# Patient Record
Sex: Female | Born: 1955 | Hispanic: Yes | Marital: Single | Smoking: Never smoker
Health system: Southern US, Community
[De-identification: ages and names within clinical notes are randomized; demographics above are authoritative.]

## PROBLEM LIST (undated history)

## (undated) DIAGNOSIS — F32A Depression, unspecified: Secondary | ICD-10-CM

## (undated) DIAGNOSIS — J45909 Unspecified asthma, uncomplicated: Secondary | ICD-10-CM

## (undated) DIAGNOSIS — F329 Major depressive disorder, single episode, unspecified: Secondary | ICD-10-CM

---

## 2005-04-13 ENCOUNTER — Encounter: Admission: RE | Admit: 2005-04-13 | Discharge: 2005-04-13 | Payer: Self-pay | Admitting: Internal Medicine

## 2006-02-19 ENCOUNTER — Encounter: Payer: Self-pay | Admitting: Family Medicine

## 2006-02-19 ENCOUNTER — Ambulatory Visit: Payer: Self-pay | Admitting: Psychiatry

## 2006-02-19 ENCOUNTER — Inpatient Hospital Stay (HOSPITAL_COMMUNITY): Admission: RE | Admit: 2006-02-19 | Discharge: 2006-02-25 | Payer: Self-pay | Admitting: Psychiatry

## 2007-02-10 ENCOUNTER — Encounter: Admission: RE | Admit: 2007-02-10 | Discharge: 2007-02-10 | Payer: Self-pay | Admitting: Internal Medicine

## 2007-07-10 ENCOUNTER — Encounter: Admission: RE | Admit: 2007-07-10 | Discharge: 2007-07-10 | Payer: Self-pay | Admitting: Internal Medicine

## 2009-01-25 ENCOUNTER — Emergency Department (HOSPITAL_COMMUNITY): Admission: EM | Admit: 2009-01-25 | Discharge: 2009-01-26 | Payer: Self-pay | Admitting: Emergency Medicine

## 2009-03-12 ENCOUNTER — Inpatient Hospital Stay (HOSPITAL_COMMUNITY): Admission: EM | Admit: 2009-03-12 | Discharge: 2009-03-14 | Payer: Self-pay | Admitting: Emergency Medicine

## 2009-03-13 ENCOUNTER — Encounter (INDEPENDENT_AMBULATORY_CARE_PROVIDER_SITE_OTHER): Payer: Self-pay | Admitting: *Deleted

## 2009-03-29 ENCOUNTER — Ambulatory Visit: Payer: Self-pay | Admitting: Diagnostic Radiology

## 2009-03-29 ENCOUNTER — Emergency Department (HOSPITAL_BASED_OUTPATIENT_CLINIC_OR_DEPARTMENT_OTHER): Admission: EM | Admit: 2009-03-29 | Discharge: 2009-03-29 | Payer: Self-pay | Admitting: Emergency Medicine

## 2009-10-23 ENCOUNTER — Encounter: Admission: RE | Admit: 2009-10-23 | Discharge: 2009-10-23 | Payer: Self-pay | Admitting: Internal Medicine

## 2009-12-28 ENCOUNTER — Other Ambulatory Visit: Payer: Self-pay | Admitting: Emergency Medicine

## 2009-12-29 ENCOUNTER — Ambulatory Visit: Payer: Self-pay | Admitting: Psychiatry

## 2009-12-29 ENCOUNTER — Inpatient Hospital Stay (HOSPITAL_COMMUNITY): Admission: RE | Admit: 2009-12-29 | Discharge: 2010-01-05 | Payer: Self-pay | Admitting: Psychiatry

## 2010-01-14 ENCOUNTER — Encounter: Admission: RE | Admit: 2010-01-14 | Discharge: 2010-01-14 | Payer: Self-pay | Admitting: Internal Medicine

## 2010-02-27 ENCOUNTER — Observation Stay (HOSPITAL_COMMUNITY): Admission: EM | Admit: 2010-02-27 | Discharge: 2010-02-28 | Payer: Self-pay | Admitting: Cardiovascular Disease

## 2010-02-28 ENCOUNTER — Encounter (INDEPENDENT_AMBULATORY_CARE_PROVIDER_SITE_OTHER): Payer: Self-pay | Admitting: *Deleted

## 2010-05-27 IMAGING — CR DG CHEST 2V
1 series · 1 of 1 positions shown · non-contrast
Comparison: 02/10/2007

CLINICAL DATA: Chest pain

CHEST - 2 VIEW

[view not recorded]
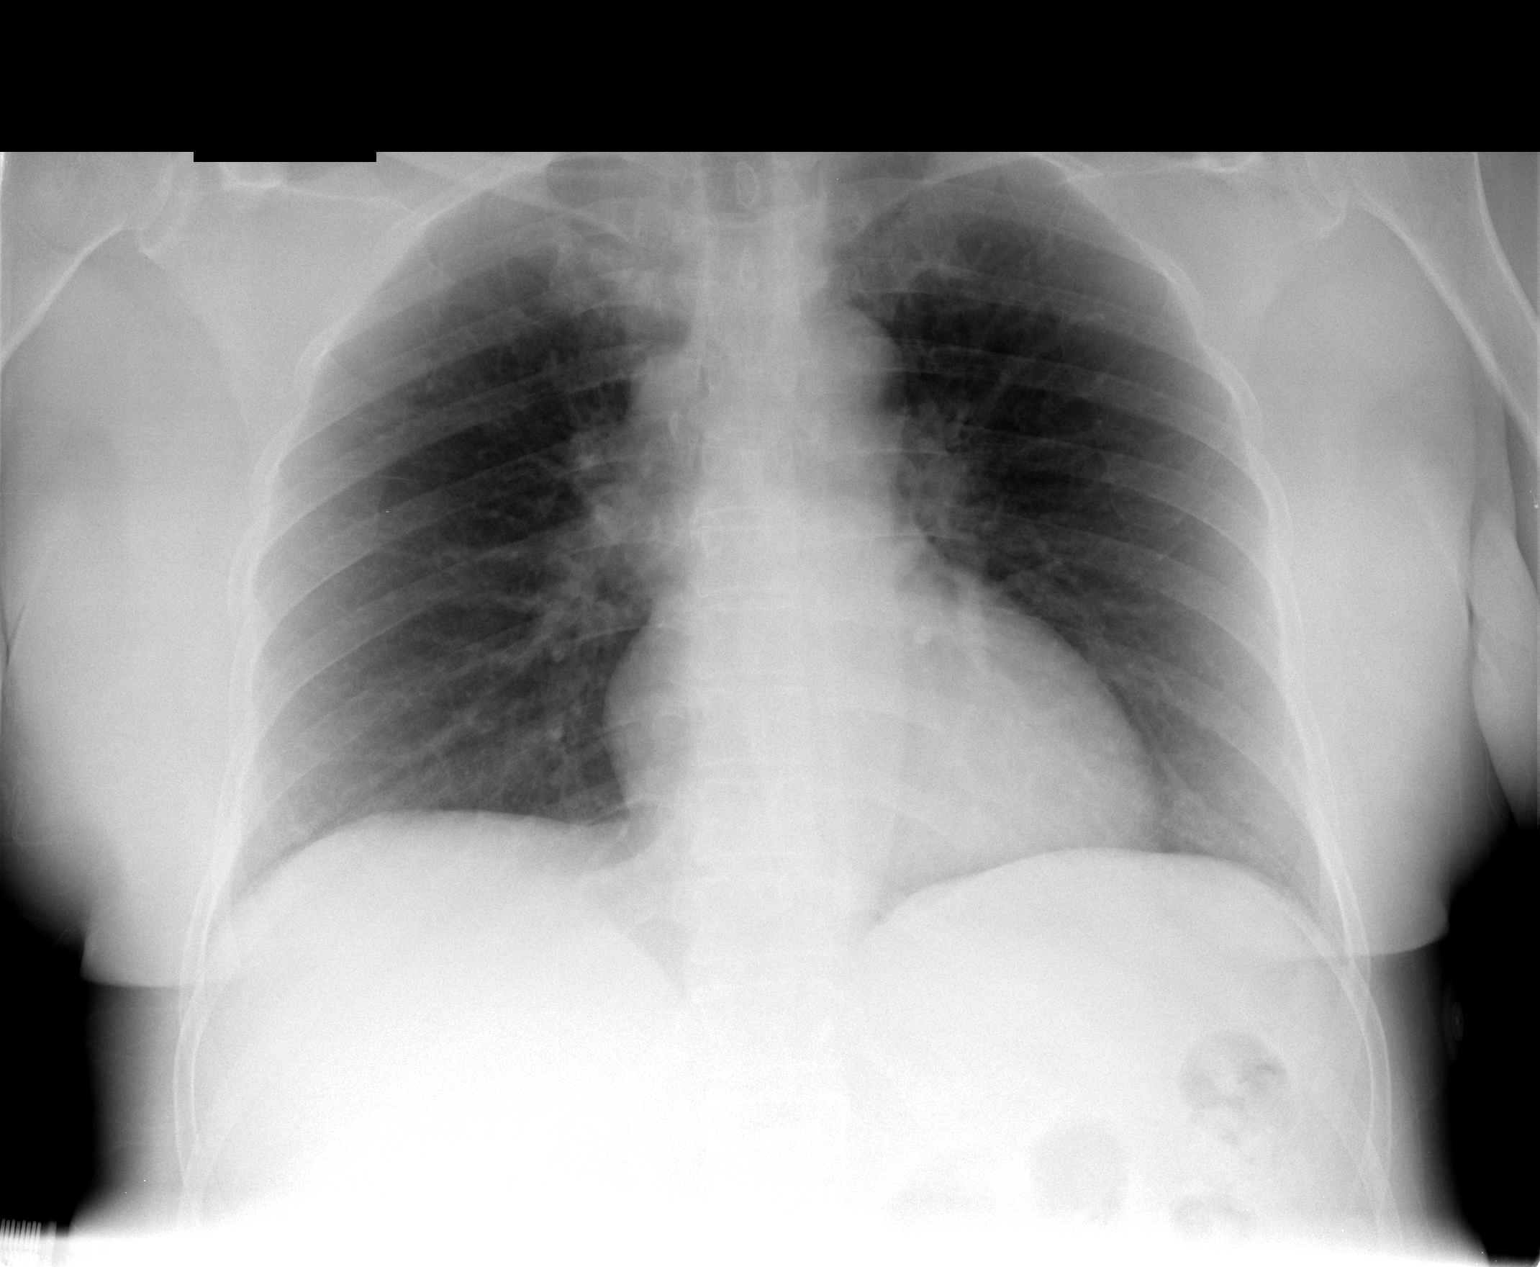

[1 of 1 positions shown; findings below may reference images not displayed]

FINDINGS: The heart size and mediastinal contours are within normal
limits.  Both lungs are clear.  The visualized skeletal structures
are unremarkable.
IMPRESSION: No active cardiopulmonary disease.

## 2010-09-04 ENCOUNTER — Emergency Department (HOSPITAL_COMMUNITY): Admission: EM | Admit: 2010-09-04 | Discharge: 2010-09-05 | Payer: Self-pay | Admitting: Emergency Medicine

## 2010-12-03 ENCOUNTER — Telehealth: Payer: Self-pay | Admitting: Internal Medicine

## 2010-12-04 ENCOUNTER — Ambulatory Visit: Payer: Self-pay | Admitting: Internal Medicine

## 2010-12-04 DIAGNOSIS — I2510 Atherosclerotic heart disease of native coronary artery without angina pectoris: Secondary | ICD-10-CM | POA: Insufficient documentation

## 2010-12-04 DIAGNOSIS — F411 Generalized anxiety disorder: Secondary | ICD-10-CM | POA: Insufficient documentation

## 2010-12-04 DIAGNOSIS — R5383 Other fatigue: Secondary | ICD-10-CM

## 2010-12-04 DIAGNOSIS — Z8679 Personal history of other diseases of the circulatory system: Secondary | ICD-10-CM | POA: Insufficient documentation

## 2010-12-04 DIAGNOSIS — I10 Essential (primary) hypertension: Secondary | ICD-10-CM | POA: Insufficient documentation

## 2010-12-04 DIAGNOSIS — F329 Major depressive disorder, single episode, unspecified: Secondary | ICD-10-CM

## 2010-12-04 DIAGNOSIS — J45909 Unspecified asthma, uncomplicated: Secondary | ICD-10-CM | POA: Insufficient documentation

## 2010-12-04 DIAGNOSIS — T7421XA Adult sexual abuse, confirmed, initial encounter: Secondary | ICD-10-CM | POA: Insufficient documentation

## 2010-12-04 DIAGNOSIS — R5381 Other malaise: Secondary | ICD-10-CM

## 2010-12-17 ENCOUNTER — Emergency Department (HOSPITAL_COMMUNITY)
Admission: EM | Admit: 2010-12-17 | Discharge: 2010-12-18 | Disposition: A | Discharge status: Other Acute Inpatient Hospital | Payer: Self-pay | Source: Home / Self Care | Admitting: Emergency Medicine

## 2010-12-17 LAB — RAPID URINE DRUG SCREEN, HOSP PERFORMED
Amphetamines: NOT DETECTED
Barbiturates: NOT DETECTED
Benzodiazepines: POSITIVE — AB
Cocaine: NOT DETECTED
Opiates: NOT DETECTED
Tetrahydrocannabinol: NOT DETECTED

## 2010-12-17 LAB — URINALYSIS, ROUTINE W REFLEX MICROSCOPIC
Bilirubin Urine: NEGATIVE
Hemoglobin, Urine: NEGATIVE
Ketones, ur: NEGATIVE mg/dL
Nitrite: NEGATIVE
Protein, ur: NEGATIVE mg/dL
Specific Gravity, Urine: 1.01 (ref 1.005–1.030)
Urine Glucose, Fasting: NEGATIVE mg/dL
Urobilinogen, UA: 0.2 mg/dL (ref 0.0–1.0)
pH: 8.5 — ABNORMAL HIGH (ref 5.0–8.0)

## 2010-12-17 LAB — BASIC METABOLIC PANEL
BUN: 11 mg/dL (ref 6–23)
CO2: 22 mEq/L (ref 19–32)
Calcium: 9.5 mg/dL (ref 8.4–10.5)
Chloride: 107 mEq/L (ref 96–112)
Creatinine, Ser: 0.81 mg/dL (ref 0.4–1.2)
GFR calc Af Amer: >60 mL/min (ref >60)
GFR calc non Af Amer: >60 mL/min (ref >60)
Glucose, Bld: 102 mg/dL — ABNORMAL HIGH (ref 70–99)
Potassium: 4.2 mEq/L (ref 3.5–5.1)
Sodium: 137 mEq/L (ref 135–145)

## 2010-12-17 LAB — DIFFERENTIAL
Basophils Absolute: 0 10*3/uL (ref 0.0–0.1)
Basophils Relative: 0 % (ref 0–1)
Eosinophils Absolute: 0.4 10*3/uL (ref 0.0–0.7)
Eosinophils Relative: 5 % (ref 0–5)
Lymphocytes Relative: 36 % (ref 12–46)
Lymphs Abs: 2.8 10*3/uL (ref 0.7–4.0)
Monocytes Absolute: 0.6 10*3/uL (ref 0.1–1.0)
Monocytes Relative: 8 % (ref 3–12)
Neutro Abs: 4 10*3/uL (ref 1.7–7.7)
Neutrophils Relative %: 51 % (ref 43–77)

## 2010-12-17 LAB — CBC
HCT: 38.6 % (ref 36.0–46.0)
Hemoglobin: 12.9 g/dL (ref 12.0–15.0)
MCH: 28.4 pg (ref 26.0–34.0)
MCHC: 33.4 g/dL (ref 30.0–36.0)
MCV: 85 fL (ref 78.0–100.0)
Platelets: 187 10*3/uL (ref 150–400)
RBC: 4.54 MIL/uL (ref 3.87–5.11)
RDW: 12.3 % (ref 11.5–15.5)
WBC: 7.7 10*3/uL (ref 4.0–10.5)

## 2010-12-18 ENCOUNTER — Inpatient Hospital Stay (HOSPITAL_COMMUNITY)
Admission: AD | Admit: 2010-12-18 | Discharge: 2010-12-25 | Payer: Self-pay | Source: Home / Self Care | Attending: Psychiatry | Admitting: Psychiatry

## 2010-12-25 ENCOUNTER — Encounter (INDEPENDENT_AMBULATORY_CARE_PROVIDER_SITE_OTHER): Payer: Self-pay | Admitting: *Deleted

## 2011-01-03 ENCOUNTER — Encounter: Payer: Self-pay | Admitting: Internal Medicine
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
## 2011-01-21 ENCOUNTER — Encounter (INDEPENDENT_AMBULATORY_CARE_PROVIDER_SITE_OTHER): Payer: Self-pay | Admitting: *Deleted

## 2011-01-25 ENCOUNTER — Encounter: Payer: Self-pay | Admitting: Internal Medicine

## 2011-01-25 ENCOUNTER — Ambulatory Visit (INDEPENDENT_AMBULATORY_CARE_PROVIDER_SITE_OTHER): Payer: Medicare Other | Admitting: Internal Medicine

## 2011-01-25 DIAGNOSIS — F3289 Other specified depressive episodes: Secondary | ICD-10-CM | POA: Insufficient documentation

## 2011-01-25 DIAGNOSIS — K625 Hemorrhage of anus and rectum: Secondary | ICD-10-CM

## 2011-01-26 ENCOUNTER — Encounter (AMBULATORY_SURGERY_CENTER): Payer: Medicare Other | Admitting: Internal Medicine

## 2011-01-26 ENCOUNTER — Encounter: Payer: Self-pay | Admitting: Internal Medicine

## 2011-01-26 DIAGNOSIS — K648 Other hemorrhoids: Secondary | ICD-10-CM

## 2011-01-26 DIAGNOSIS — K921 Melena: Secondary | ICD-10-CM

## 2011-01-26 DIAGNOSIS — K625 Hemorrhage of anus and rectum: Secondary | ICD-10-CM
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
## 2011-01-28 NOTE — Discharge Summary (Signed)
Summary: Chest Pain    NAME:  Kara Chavez, Kara Chavez           ACCOUNT NO.:  [id number]      MEDICAL RECORD NO.:  [id number]          PATIENT TYPE:  INP      LOCATION:  3707                         FACILITY:  [facility]      PHYSICIAN:  Thurmon Fair, MD     DATE OF BIRTH:  Jul 13, 1956      DATE OF ADMISSION:  02/27/2010   DATE OF DISCHARGE:  02/28/2010                                  DISCHARGE SUMMARY      DISCHARGE DIAGNOSES:   1. Chest pain, unclear etiology with normal coronaries this admission.   2. Jehovah's Witness.   3. History of anxiety, on multiple medications.      HOSPITAL COURSE:  The patient is a 55 year old female, who was referred   to [place] for evaluation of chest pain.  She had an echocardiogram, which   was abnormal and subsequently underwent a Myoview study, which also was   abnormal with perfusion abnormalities suggestive of ischemia in the   basilar and mid inferior walls.  She saw Dr. Royann Shivers on February 24, 2010,   and was set up for diagnostic catheterization.  She presented to the   office as a walk-in on February 27, 2010, with chest pain.  Her symptoms   were hard to sort out, but we felt it best to go ahead and admit her and   proceed with catheterization.  This was done by Dr. Royann Shivers on February 27, 2010, revealed normal coronaries and normal LV function.  We feel   she could be discharged on February 28, 2010.  She will follow up with Dr.   Royann Shivers and Dr. Allyne Gee.      LABORATORIES:  As an outpatient, her white count was 4.7, hemoglobin   12.2, hematocrit 37.1, and platelets 184.  INR 1.2.  TSH 1.4.  Sodium   143, potassium 4.3, BUN 16, and creatinine 1.3.  Urinalysis was   unremarkable.  EKG shows sinus rhythm without acute changes.  Please see   med rec for complete discharge medications, but basically we did not   change any of her medicines, and she will follow up with Dr. Royann Shivers in   a couple weeks, and Dr. Dorothyann Peng.               [name],  P.A.               Thurmon Fair, MD            LKK/MEDQ  D:  02/28/2010  T:  02/28/2010  Job:  [id number]      cc:   Thurmon Fair, MD   Candyce Churn. Allyne Gee, M.D.      Electronically Signed by Corine Shelter P.A. on 03/12/2010 01:36:56 PM   Electronically Signed by Thurmon Fair M.D. on 03/20/2010 11:36:49 AM

## 2011-01-28 NOTE — Letter (Signed)
Summary: New Patient letter  [facility]  [address] [place], [place] [address]   Phone: [phone]  Fax: [phone]       01/21/2011 MRN: [id number]  Kara Chavez [address] [place], [place]  [address]  Dear Kara Chavez,  Welcome to the Gastroenterology Division at [facility].    You are scheduled to see Dr.  Lina Sar on January 25, 2011 at 2:45pm on the 3rd floor at [facility], [address]  We ask that you try to arrive at our office 15 minutes prior to your appointment time to allow for check-in.  We would like you to complete the enclosed self-administered evaluation form prior to your visit and bring it with you on the day of your appointment.  We will review it with you.  Also, please bring a complete list of all your medications or, if you prefer, bring the medication bottles and we will list them.  Please bring your insurance card so that we may make a copy of it.  If your insurance requires a referral to see a specialist, please bring your referral form from your primary care physician.  Co-payments are due at the time of your visit and may be paid by cash, check or credit card.     Your office visit will consist of a consult with your physician (includes a physical exam), any laboratory testing he/she may order, scheduling of any necessary diagnostic testing (e.g. x-ray, ultrasound, CT-scan), and scheduling of a procedure (e.g. Endoscopy, Colonoscopy) if required.  Please allow enough time on your schedule to allow for any/all of these possibilities.    If you cannot keep your appointment, please call [phone] to cancel or reschedule prior to your appointment date.  This allows [place] the opportunity to schedule an appointment for another patient in need of care.  If you do not cancel or reschedule by 5 p.m. the business day prior to your appointment date, you will be charged a $50.00 late cancellation/no-show fee.     Thank you for choosing [facility] for your medical needs.  We appreciate the opportunity to care for you.  Please visit [place] at our website  to learn more about our practice.                     Sincerely,                                                             The Gastroenterology Division
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
## 2011-02-03 NOTE — Procedures (Signed)
Summary: Colonoscopy  Patient: Kara Chavez Note: All result statuses are Final unless otherwise noted.  Tests: (1) Colonoscopy (COL)   COL Colonoscopy           DONE     [facility]     [address]     [place], [place]  [address]           COLONOSCOPY PROCEDURE REPORT           PATIENT:  Kara Chavez, Kara Chavez  MR#:  [id number]     BIRTHDATE:  [birth year], 54 yrs. old  GENDER:  female     ENDOSCOPIST:  Hedwig Morton. Juanda Chance, MD     REF. BY:  Della Goo, M.D.     PROCEDURE DATE:  01/26/2011     PROCEDURE:  Colonoscopy 19[address]     ASA CLASS:  Class III     INDICATIONS:  hematochezia, rectal bleeding anoscopic exam     yesteday did not show any significant lesion, pt started on     Analpram cream 2.5%     MEDICATIONS:   propofol (Diprivan) 200 mg           DESCRIPTION OF PROCEDURE:   After the risks benefits and     alternatives of the procedure were thoroughly explained, informed     consent was obtained.  Digital rectal exam was performed and     revealed no rectal masses.   The LB 180AL [id number] endoscope was     introduced through the anus and advanced to the cecum, which was     identified by both the appendix and ileocecal valve, without     limitations.  The quality of the prep was good, using MiraLax.     The instrument was then slowly withdrawn as the colon was fully     examined.     <<PROCEDUREIMAGES>>           FINDINGS:  Internal Hemorrhoids were found (see image7, image6,     and image5). small inactive internal hems  This was otherwise a     normal examination of the colon (see image2, image1, and image3).     Retroflexed views in the rectum revealed no abnormalities.    The     scope was then withdrawn from the patient and the procedure     completed.           COMPLICATIONS:  None     ENDOSCOPIC IMPRESSION:     1) Internal hemorrhoids     2) Otherwise normal examination     hematochezia likely secondary to internal hemorrhoids  RECOMMENDATIONS:     continue Analpram cream bid as ordered yesterday,     REPEAT EXAM:  In 10 year(s) for.           ______________________________     Hedwig Morton. Juanda Chance, MD           CC:           n.     eSIGNED:   Hedwig Morton. Brodie at 01/26/2011 03:22 PM           Ailene Rud, [phone]  Note: An exclamation mark (!) indicates a result that was not dispersed into the flowsheet. Document Creation Date: 01/26/2011 3:22 PM _______________________________________________________________________  (1) Order result status: Final Collection or observation date-time: 01/26/2011 15:10 Requested date-time:  Receipt date-time:  Reported date-time:  Referring Physician:   Ordering Physician: Lina Sar [phone]) Specimen Source:  Source: Launa Grill Order  Number: [id number] Lab site:   Appended Document: Colonoscopy    Clinical Lists Changes  Observations: Added new observation of COLONNXTDUE: 01/2021 (01/26/2011 16:09)

## 2011-02-12 ENCOUNTER — Ambulatory Visit (HOSPITAL_COMMUNITY): Payer: Self-pay | Admitting: Psychology

## 2011-02-25 LAB — CBC
HCT: 36.4 % (ref 36.0–46.0)
MCH: 28.3 pg (ref 26.0–34.0)
MCHC: 33.8 g/dL (ref 30.0–36.0)
MCV: 83.9 fL (ref 78.0–100.0)
Platelets: 159 10*3/uL (ref 150–400)
RDW: 12.9 % (ref 11.5–15.5)
WBC: 5 10*3/uL (ref 4.0–10.5)

## 2011-02-25 LAB — BASIC METABOLIC PANEL
BUN: 15 mg/dL (ref 6–23)
CO2: 26 mEq/L (ref 19–32)
Calcium: 9 mg/dL (ref 8.4–10.5)
Chloride: 107 mEq/L (ref 96–112)
Creatinine, Ser: 0.77 mg/dL (ref 0.4–1.2)
GFR calc Af Amer: >60 mL/min (ref >60)
GFR calc non Af Amer: >60 mL/min (ref >60)
Glucose, Bld: 115 mg/dL — ABNORMAL HIGH (ref 70–99)
Potassium: 3.5 mEq/L (ref 3.5–5.1)
Sodium: 139 mEq/L (ref 135–145)

## 2011-02-25 LAB — DIFFERENTIAL
Basophils Absolute: 0 10*3/uL (ref 0.0–0.1)
Eosinophils Absolute: 0.3 10*3/uL (ref 0.0–0.7)
Eosinophils Relative: 6 % — ABNORMAL HIGH (ref 0–5)
Monocytes Absolute: 0.3 10*3/uL (ref 0.1–1.0)

## 2011-02-25 LAB — ETHANOL: Alcohol, Ethyl (B): <5 mg/dL (ref 0–10)

## 2011-02-25 LAB — RAPID URINE DRUG SCREEN, HOSP PERFORMED
Amphetamines: NOT DETECTED
Barbiturates: NOT DETECTED
Benzodiazepines: NOT DETECTED
Cocaine: NOT DETECTED
Opiates: NOT DETECTED
Tetrahydrocannabinol: NOT DETECTED

## 2011-03-01 LAB — BASIC METABOLIC PANEL
BUN: 22 mg/dL (ref 6–23)
CO2: 26 mEq/L (ref 19–32)
Calcium: 9.6 mg/dL (ref 8.4–10.5)
Chloride: 104 mEq/L (ref 96–112)
Creatinine, Ser: 1.07 mg/dL (ref 0.4–1.2)
GFR calc Af Amer: >60 mL/min (ref >60)
GFR calc non Af Amer: 54 mL/min — ABNORMAL LOW (ref >60)
Glucose, Bld: 110 mg/dL — ABNORMAL HIGH (ref 70–99)
Potassium: 3.6 mEq/L (ref 3.5–5.1)
Sodium: 138 mEq/L (ref 135–145)

## 2011-03-01 LAB — DIFFERENTIAL
Basophils Relative: 0 % (ref 0–1)
Eosinophils Relative: 18 % — ABNORMAL HIGH (ref 0–5)
Lymphocytes Relative: 36 % (ref 12–46)
Neutro Abs: 2.4 10*3/uL (ref 1.7–7.7)
Neutrophils Relative %: 38 % — ABNORMAL LOW (ref 43–77)

## 2011-03-01 LAB — CBC
Hemoglobin: 12.3 g/dL (ref 12.0–15.0)
MCHC: 33.2 g/dL (ref 30.0–36.0)
MCV: 85.8 fL (ref 78.0–100.0)
RBC: 4.33 MIL/uL (ref 3.87–5.11)
WBC: 6.2 10*3/uL (ref 4.0–10.5)

## 2011-03-01 LAB — RAPID URINE DRUG SCREEN, HOSP PERFORMED
Barbiturates: NOT DETECTED
Benzodiazepines: NOT DETECTED

## 2011-03-01 LAB — ETHANOL: Alcohol, Ethyl (B): <5 mg/dL (ref 0–10)

## 2011-03-07 LAB — GLUCOSE, CAPILLARY: Glucose-Capillary: 79 mg/dL (ref 70–99)

## 2011-03-24 LAB — URINALYSIS, ROUTINE W REFLEX MICROSCOPIC
Bilirubin Urine: NEGATIVE
Glucose, UA: NEGATIVE mg/dL
Hgb urine dipstick: NEGATIVE
Ketones, ur: NEGATIVE mg/dL
Protein, ur: NEGATIVE mg/dL

## 2011-03-24 LAB — BASIC METABOLIC PANEL
CO2: 27 mEq/L (ref 19–32)
Chloride: 107 mEq/L (ref 96–112)
GFR calc Af Amer: >60 mL/min (ref >60)
Potassium: 4.3 mEq/L (ref 3.5–5.1)

## 2011-03-24 LAB — CBC
HCT: 38.4 % (ref 36.0–46.0)
Hemoglobin: 12.5 g/dL (ref 12.0–15.0)
MCHC: 32.6 g/dL (ref 30.0–36.0)
MCV: 87.7 fL (ref 78.0–100.0)
RBC: 4.38 MIL/uL (ref 3.87–5.11)
WBC: 5.4 10*3/uL (ref 4.0–10.5)

## 2011-03-24 LAB — DIFFERENTIAL
Basophils Relative: 0 % (ref 0–1)
Eosinophils Absolute: 0.3 10*3/uL (ref 0.0–0.7)
Eosinophils Relative: 6 % — ABNORMAL HIGH (ref 0–5)
Lymphs Abs: 2.5 10*3/uL (ref 0.7–4.0)
Monocytes Absolute: 0.4 10*3/uL (ref 0.1–1.0)
Monocytes Relative: 8 % (ref 3–12)

## 2011-03-24 LAB — POCT TOXICOLOGY PANEL

## 2011-03-24 LAB — URINE MICROSCOPIC-ADD ON

## 2011-03-25 LAB — CBC
HCT: 37.8 % (ref 36.0–46.0)
MCV: 86.8 fL (ref 78.0–100.0)
RBC: 4.35 MIL/uL (ref 3.87–5.11)
WBC: 5.6 10*3/uL (ref 4.0–10.5)

## 2011-03-25 LAB — POCT CARDIAC MARKERS
CKMB, poc: <1 ng/mL — ABNORMAL LOW (ref 1.0–8.0)
Troponin i, poc: <0.05 ng/mL (ref 0.00–0.09)
Troponin i, poc: <0.05 ng/mL (ref 0.00–0.09)

## 2011-03-25 LAB — DIFFERENTIAL
Eosinophils Absolute: 0.2 10*3/uL (ref 0.0–0.7)
Eosinophils Relative: 4 % (ref 0–5)
Lymphs Abs: 2.2 10*3/uL (ref 0.7–4.0)
Monocytes Relative: 6 % (ref 3–12)

## 2011-03-25 LAB — BASIC METABOLIC PANEL
BUN: 16 mg/dL (ref 6–23)
CO2: 22 mEq/L (ref 19–32)
Chloride: 105 mEq/L (ref 96–112)
GFR calc Af Amer: >60 mL/min (ref >60)
Potassium: 3.3 mEq/L — ABNORMAL LOW (ref 3.5–5.1)

## 2011-03-25 LAB — POCT I-STAT, CHEM 8
BUN: 17 mg/dL (ref 6–23)
Calcium, Ion: 1.18 mmol/L (ref 1.12–1.32)
Chloride: 107 mEq/L (ref 96–112)
Creatinine, Ser: 0.9 mg/dL (ref 0.4–1.2)
Glucose, Bld: 122 mg/dL — ABNORMAL HIGH (ref 70–99)

## 2011-03-30 LAB — RAPID URINE DRUG SCREEN, HOSP PERFORMED
Barbiturates: NOT DETECTED
Benzodiazepines: NOT DETECTED

## 2011-03-30 LAB — DIFFERENTIAL
Basophils Absolute: 0 10*3/uL (ref 0.0–0.1)
Basophils Relative: 1 % (ref 0–1)
Monocytes Absolute: 0.6 10*3/uL (ref 0.1–1.0)
Neutro Abs: 2.6 10*3/uL (ref 1.7–7.7)
Neutrophils Relative %: 39 % — ABNORMAL LOW (ref 43–77)

## 2011-03-30 LAB — CBC
Hemoglobin: 13.4 g/dL (ref 12.0–15.0)
MCHC: 34.7 g/dL (ref 30.0–36.0)
RDW: 13.8 % (ref 11.5–15.5)

## 2011-03-30 LAB — POCT I-STAT, CHEM 8
Calcium, Ion: 1.14 mmol/L (ref 1.12–1.32)
Chloride: 110 mEq/L (ref 96–112)
HCT: 40 % (ref 36.0–46.0)
Hemoglobin: 13.6 g/dL (ref 12.0–15.0)

## 2011-04-27 NOTE — Discharge Summary (Signed)
NAMEMEE, MACDONNELL     ACCOUNT NO.:  [id number]   MEDICAL RECORD NO.:  [id number]          PATIENT TYPE:  INP   LOCATION:  3037                         FACILITY:  [facility]   PHYSICIAN:  Deanna Artis. Hickling, M.D.DATE OF BIRTH:  09-02-1956   DATE OF ADMISSION:  03/11/2009  DATE OF DISCHARGE:  03/14/2009                               DISCHARGE SUMMARY   FINAL DIAGNOSES:  Functional weakness and numbness of the left arm and  leg. (342.02, 782.0)   PROCEDURES:  1. MRI scan of the brain limited.  2. CT chest, abdomen, and pelvis.   COMPLICATIONS:  None.   SUMMARY OF THE HOSPITALIZATION:  The patient is a 55 year old Hispanic  female who presented as a code stroke to [facility]  around 2254 hours on March 11, 2009.  CT scan of the brain was normal.  This was perplexing in the sense that the patient claimed to have had a  stroke 12 years before and to have walked with a cane for 6 years.   Her assessment showed evidence of inconsistent findings and functional  left-sided weakness.  Nonetheless, in order to be certain that she did  not have a stroke that would require treatment with t-PA, a limited MRI  scan of the brain was carried out and was normal.   The patient showed severe weakness of the proximal muscles of the left  arm and leg, but was able wiggle her fingers and to a lesser extent her  toes.  She had an NIH stroke scale of 8.  She was anxious, trembling,  but after being given 2 mg of Ativan for the MRI scan, she seemed much  calmer.   She had a stroke swallow screen that she failed initially, and then she  passed it.  Given that there was no stroke and the symptoms were  functional, she was not a candidate for t-PA.   The patient was placed on the regular service.  The next morning, she  had marked improvement in strength on her left side.  In the upper  extremity, she was 4/5 with good fine motor movements, in the lower  extremity, she was 3/5  proximally and 4/5 distally.  There still  appeared to be sensory abnormality, but it is with midline and was felt  to be functional.   We were able to have her seen by Physical and Occupational Therapy and  get her up with a walker.  This allowed Foley catheter to be  discontinued.  She claimed to be unable to urinate when lying down.   On the morning of March 13, 2009, she had further improved.  She had  normal strength in the right upper extremity.  In the left upper  extremity, she had 4/5 strength in the biceps, triceps, and 4-/5 in the  deltoid.  Fine motor movements were fair.  Left lower extremity hip  flexor 3, psoas 4+, knee flexor 4, knee extensor 4+ with plantar flexor  4+, dorsiflexor 4-.  She had no reflex predominance, though her reflexes  were brisk.  Her toes were bilaterally flexor.  She had an inconsistent  sensory examination,  which again split the midline.   Because of ongoing symptoms of left flank pain, I ordered a CT scan of  the chest, abdomen, and pelvis, this returned normal.  This was done  without and with contrast.   This morning, the patient's vital signs, temperature 97.8, blood  pressure 113/73, resting pulse 62, respirations 16, oxygen saturation  96%.  The patient is awake, alert.  She has minimal complaints of pain  in her left flank and is not using narcotic medications.  Her lungs were  clear.  There were no bruits.  She had a supple neck.  Her heart showed  no murmurs.  Mental status, alert, pleasant without dysphagia or  dysarthria.  Cranial nerves, round reactive pupils.  Visual fields full.  Extraocular movements full.  Symmetric facial strength.  Midline tongue  and uvula.  Motor examination, normal right-sided strength.  The left  side is near normal with some giveaway both proximally and distally.  She has drift of her left arm, but no pronation.  She had good fine  motor movements to finger apposition.  When she walked with a walker,  she  picked up her left foot.  She is walking stably with a walker.  Deep  tendon reflexes are brisk without clonus.  She had bilateral flexor  plantar responses.  Sensation is still subjectively decreased with  midline.  She had good stereoagnosis.   The patient will be discharged home.  Recommendations by Physical and  Occupational Therapy have been made for home PT and OT.  I will also add  home nursing.  She will return to see the nurse practitioner in about 2  months or sooner as needed.  The nurse is needed in order to make  certain that she has a safe environment at home.  I do not think that  involving a psychiatrist will be helpful here.  She has seen a  psychiatrist for her depression and anxiety.  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  She has  asked me to fill out a disability form, which I will do, but I have told  her that it will not help her and she will not gain disability from her  status.   I should mention the patient just became a citizen of the [place]  on the day she had her symptoms.   Her discharge medications include Effexor XR 75 mg 2 caplets daily,  clonazepam 1 mg twice daily, Soma 350 mg 3 times daily, naproxen 500 mg  twice daily, Advair Diskus 100/50 one puff daily as needed, Ventolin 90  mcg 1 puff daily as needed.   DRUG ALLERGIES:  ASPIRIN (hives) and CAFFEINE (agitation).   She is discharged to home improved condition.  She will be followed by  one of my nurse practitioner's in 2 months' time.  There is no specific  therapy that we can offer this woman, but I believe that she needs  followup at least once.      Deanna Artis. Sharene Skeans, M.D.  Electronically Signed     [facility]/MED[facility]  D:  03/14/2009  T:  03/14/2009  Job:  [id number]

## 2011-04-27 NOTE — H&P (Signed)
Kara Chavez, Kara Chavez     ACCOUNT NO.:  [id number]   MEDICAL RECORD NO.:  [id number]          PATIENT TYPE:  INP   LOCATION:  3037                         FACILITY:  [facility]   PHYSICIAN:  Deanna Artis. Hickling, M.D.DATE OF BIRTH:  [birth year]   DATE OF ADMISSION:  03/11/2009  DATE OF DISCHARGE:                              HISTORY & PHYSICAL   CHIEF COMPLAINT:  Left-sided weakness and pain.   HISTORY OF PRESENT CONDITION:  The patient is a 55 year old married  Hispanic woman, mother of 4, who had a stroke 12 years ago under very  similar circumstances today.  She stated that she was disabled and walk  with a cane for 6 years and has been well for the past 6 years.   The patient had a citizenship ceremony in [place] today and became a  [place] citizen.  She was very excited and became upset, may have had a panic-  like attack.   She settled down and went to a [employer].  There she  developed pain on her left side and weakness.  It was last known normal  at 9:15 p.m.   She arrived at [facility] at 2254.  She was evaluated by the  emergency physicians and sent for a CT scan.  I was contacted at 2320  and arrived 2330 to review her CT scan and to examine her 2335.   The patient has had a history of depression and anxiety, asthma.  She  has had 4 cesarean sections.  She has no other stroke risk factors  except for the possible previous stroke.  Her CT scan of the brain was  entirely normal.  This makes the claim of prior stroke highly suspect.   CURRENT MEDICATIONS:  Clonazepam, dose unknown 3 times daily.  She has  some other antidepressant.  We need to get those medicines from home.   DRUG ALLERGIES:  She has intolerance to ASPIRIN and also CAFFEINE.   FAMILY HISTORY:  Positive for stroke in her grandfather, myocardial  infarction in father, both fatal; anorexia, depression; and death in her  mother at age 18.  She had a sister in her 76s, who had some  form of  heart problem.   SOCIAL HISTORY:  The patient has 4 children.  She is married.  Her  husband and one her children at bedside.  Children range in age from 71  down to 51.  She is a Neurosurgeon.  She does not use tobacco, alcohol, or  drugs.   REVIEW OF SYSTEMS:  Remarkable for arthritis, anxiety and depression, 12  system review is otherwise negative.  There is a very significant  language barrier.  I had some translation.   PHYSICAL EXAMINATION:  VITAL SIGNS:  Today, temperature 98.4; blood  pressure initially 106/72, rose to 156/76; heart rate initially 64, rose  to 80; respirations initially 14, rose to 22; and she became more  anxious.  Oxygen saturation is 98%.  Weight estimated 155 pounds.  EARS, NOSE, AND THROAT:  No infections.  NECK:  Supple.  No bruits.  LUNGS:  Clear.  HEART:  No murmurs.  Pulses normal.  ABDOMEN:  The bowel sounds normal.  No hepatosplenomegaly, somewhat  protuberant.  SKIN:  No lesions.  NEUROLOGIC:  Awake, anxious, trembling inconsistent on her examination.  She spoke fluent [place] and a little bit of [place].  CRANIAL NERVES:  Round and reactive pupils.  Visual fields were full,  however inconsistent.  When I brought my hand over to the left superior  quadrant, she shot a glance over to my hand.  She inconsistently counted  to double simultaneous stimuli on the right side and left side.  She  always was able to count well on the left side.  I did not feel that  this was evidence of extinguishment.   She had symmetric facial strength, midline tongue, and uvula.  She had  air conduction greater than bone conduction.  Motor examination, the  patient had normal strength on the right side in her arm and her leg.  Good fine motor movements.  Good finger apposition.  No drift.  On the  left side, she was able to wiggle her fingers on her toes a bit, but she  did not have any proximal movements at all.  The arm flopped down;  however,  interestingly it would not flop and hit her in the face.   Sensation was different in comparing the right side and left side, but  again it was inconsistent.  Sometime, she had difficulty telling 1 point  or 2 points, even several inches apart on the same right arm.   She had good stereognosis, however, in both hands.  Deep tendon reflexes  were symmetrically diminished.  The patient had a right flexor plantar  with strong withdrawal response and had no response to plantar  stimulation on the left.   LABORATORY STUDIES:  Sodium 133, potassium 3.3, chloride 105, CO2 22,  BUN 16, creatinine is 0.82, glucose 110, and calcium 9.3.  White blood  cell count 5600, hemoglobin 12.9, hematocrit 37.8, and platelet count  181,000.  PT 14.5 and INR 1.1.   IMPRESSION:  1. Left-sided weakness and numbness, as part of a hysterical      conversion reaction. (342.02, 782.0)  2. History of 6 years of disability on her left side with a normal CT      scan.   I ordered an MRI scan that limited MRI scan of the brain for diffusion-  weighted imaging, T2 and flare.  They are all normal.  There is no  evidence of acute stroke.  There is no evidence of remote stroke.  The  patient did not need and did not receive t-PA.  We will admit her in the  hospital overnight.  If she continues to have disability, we may have  her seen by psychiatrist with translator.  I have conveyed my opinions  to the family and asked them not to be confrontative.      Deanna Artis. Sharene Skeans, M.D.  Electronically Signed     [facility]/MEDQ  D:  03/12/2009  T:  03/12/2009  Job:  [id number]

## 2011-04-30 NOTE — Discharge Summary (Signed)
NAMESHELANDA, Kara Chavez          ACCOUNT NO.:  [id number]   MEDICAL RECORD NO.:  [id number]          PATIENT TYPE:  IPS   LOCATION:  0302                          FACILITY:  [facility]   PHYSICIAN:  Geoffery Lyons, M.D.      DATE OF BIRTH:  July 20, 1956   DATE OF ADMISSION:  02/19/2006  DATE OF DISCHARGE:  02/25/2006                                 DISCHARGE SUMMARY   CHIEF COMPLAINT AND PRESENTING ILLNESS:  This was the first admission to  [facility]  for this 55 year old separated female,  voluntarily admitted.  History of depression worsening, with thoughts of  suicide, no particular plan, positive auditory hallucinations calling her  name, feeling very stressed, unable to identify any specific stressors.  Not  being able to sleep.  Dislocated left shoulder recently playing with a  child.  Feeling very anxious.   PAST PSYCHIATRIC HISTORY:  First time Behavioral Health,prior to his  admission he chose to be hospitalized, no current outpatient treatment.   ALCOHOL AND DRUG HISTORY:  Non smoker, denies any active alcohol and drug  use.   PAST MEDICAL HISTORY:  History of 2 CVAs, last one in 1997,   MEDICATIONS:  Zoloft 200 mg per day, Prolixin 10 mg per day, naproxen 500  twice a day, Klonopin.   PHYSICAL EXAMINATION:  Performed, failed to show any acute findings.   LABORATORY WORKUP:  CBC:  White blood cells 6.8, hemoglobin 12.3.  Blood  chemistries: SGOT 20, SGPT 22, total bilirubin 0.7.  TSH 2.446.   MENTAL STATUS EXAM:  Reveals a fully alert female, very soft spoken, normal  rate, tempo and production.  Mood depressed, anxious, affect depressed,  endorsing positive auditory hallucinations, some underlying paranoia.  Cognition well preserved.   ADMISSION DIAGNOSES:  AXIS I:  Major depression with psychotic features.  AXIS II:  No diagnosis.  AXIS III:  Status post 2 cardiovascular accidents.  AXIS IV:  Moderate.  AXIS V:  Upon admission 25-30, highest global  assessment of functioning in  last year 65-70.   COURSE IN HOSPITAL:  She was admitted and started in individual and group  psychotherapy.  She was placed on the Zoloft 100 mg in the morning, Klonopin  1 mg twice a day, naproxen 500 twice a day, Prolixin 10 mg per day, was  given Ambien for sleep.  She was eventually placed on Zoloft 50, placed on  Zyprexa rather than Prolixin and we evaluated the shoulder further.  Zyprexa  was increased to 10 mg at night, Zoloft was discontinued and she was started  on Effexor.  She endorsed persistent depression, got worse a few months  prior to this admission.  Initially from the [place], one child,  husband left to go to [place] has been raising the child by herself, on  disability, status post CVA with some residual motor deficits.  Endorsed  wanting to die, no ambition, no motivation, anhedonia, suicidal ruminations,  psychomotor retardation, hard time processing.  Felt that the Zoloft was not  working any more.  As we modified medications, she endorsed she was starting  to feel  a little better.  By March 13 she was not hearing voices, started to  open up, talked about the stressors.  Pain in the shoulder was an issue.  X-  ray was not suggestive of any gross trauma.  She had an appointment with  orthopedics we had to reschedule.  We went up on the Effexor to 75 mg per  day.  On March 15 she was sleeping better, she was starting to feel some  better, no hallucinations.  March 16 she was in full contact with reality,  no suicidal or homicidal ideas, no hallucinations, no delusions, objectively  better.  Mood improved, affect brighter, willing and motivated to pursue  further outpatient treatment.   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  AXIS II:  No diagnosis.  AXIS III:  Status post two cardiovascular accidents and trauma to shoulder.  AXIS IV:  Moderate.  AXIS V:  Upon discharge 50-55.    DISCHARGE MEDICATIONS:  1.  Klonopin 1 mg twice a day.  2.  Naproxen 500 twice a day.  3.  Soma 350 every 8 hours as needed.  4.  Zyprexa 10 mg at night.  5.  Effexor XR 75 mg per day.   DISPOSITION:  Follow up with Dr. Raquel James.      Geoffery Lyons, M.D.  Electronically Signed     IL/MEDQ  D:  03/14/2006  T:  03/16/2006  Job:  [id number]

## 2011-05-07 ENCOUNTER — Other Ambulatory Visit: Payer: Self-pay | Admitting: Internal Medicine

## 2011-05-07 DIAGNOSIS — Z1231 Encounter for screening mammogram for malignant neoplasm of breast: Secondary | ICD-10-CM

## 2011-05-17 ENCOUNTER — Ambulatory Visit
Admission: RE | Admit: 2011-05-17 | Discharge: 2011-05-17 | Disposition: A | Discharge status: Home / Self Care | Payer: Medicare Other | Source: Ambulatory Visit | Attending: Internal Medicine | Admitting: Internal Medicine

## 2011-05-17 DIAGNOSIS — Z1231 Encounter for screening mammogram for malignant neoplasm of breast: Secondary | ICD-10-CM

## 2011-08-06 ENCOUNTER — Emergency Department (HOSPITAL_COMMUNITY)
Admission: EM | Admit: 2011-08-06 | Discharge: 2011-08-09 | Disposition: A | Discharge status: Home / Self Care | Payer: Medicare Other | Attending: Emergency Medicine | Admitting: Emergency Medicine

## 2011-08-06 ENCOUNTER — Emergency Department (HOSPITAL_COMMUNITY): Payer: Medicare Other

## 2011-08-06 DIAGNOSIS — Z8673 Personal history of transient ischemic attack (TIA), and cerebral infarction without residual deficits: Secondary | ICD-10-CM | POA: Insufficient documentation

## 2011-08-06 DIAGNOSIS — F411 Generalized anxiety disorder: Secondary | ICD-10-CM | POA: Insufficient documentation

## 2011-08-06 DIAGNOSIS — R259 Unspecified abnormal involuntary movements: Secondary | ICD-10-CM | POA: Insufficient documentation

## 2011-08-06 DIAGNOSIS — M79609 Pain in unspecified limb: Secondary | ICD-10-CM | POA: Insufficient documentation

## 2011-08-06 DIAGNOSIS — R42 Dizziness and giddiness: Secondary | ICD-10-CM | POA: Insufficient documentation

## 2011-08-06 DIAGNOSIS — F341 Dysthymic disorder: Secondary | ICD-10-CM | POA: Insufficient documentation

## 2011-08-06 DIAGNOSIS — G319 Degenerative disease of nervous system, unspecified: Secondary | ICD-10-CM | POA: Insufficient documentation

## 2011-08-06 DIAGNOSIS — R29898 Other symptoms and signs involving the musculoskeletal system: Secondary | ICD-10-CM | POA: Insufficient documentation

## 2011-08-06 DIAGNOSIS — Z79899 Other long term (current) drug therapy: Secondary | ICD-10-CM | POA: Insufficient documentation

## 2011-08-06 LAB — COMPREHENSIVE METABOLIC PANEL
ALT: 17 U/L (ref 0–35)
AST: 20 U/L (ref 0–37)
Albumin: 3.7 g/dL (ref 3.5–5.2)
CO2: 20 mEq/L (ref 19–32)
Chloride: 105 mEq/L (ref 96–112)
GFR calc non Af Amer: >60 mL/min (ref >60)
Sodium: 137 mEq/L (ref 135–145)
Total Bilirubin: 0.4 mg/dL (ref 0.3–1.2)

## 2011-08-06 LAB — CBC
Hemoglobin: 12.5 g/dL (ref 12.0–15.0)
RBC: 4.49 MIL/uL (ref 3.87–5.11)
WBC: 7 10*3/uL (ref 4.0–10.5)

## 2011-08-06 LAB — URINALYSIS, ROUTINE W REFLEX MICROSCOPIC
Glucose, UA: NEGATIVE mg/dL
Leukocytes, UA: NEGATIVE
Nitrite: NEGATIVE
Specific Gravity, Urine: 1.007 (ref 1.005–1.030)
pH: 8.5 — ABNORMAL HIGH (ref 5.0–8.0)

## 2011-08-06 LAB — DIFFERENTIAL
Basophils Absolute: 0 10*3/uL (ref 0.0–0.1)
Basophils Relative: 0 % (ref 0–1)
Monocytes Relative: 4 % (ref 3–12)
Neutro Abs: 5.1 10*3/uL (ref 1.7–7.7)
Neutrophils Relative %: 74 % (ref 43–77)

## 2011-08-06 LAB — RAPID URINE DRUG SCREEN, HOSP PERFORMED
Barbiturates: NOT DETECTED
Benzodiazepines: NOT DETECTED
Tetrahydrocannabinol: NOT DETECTED

## 2011-08-07 ENCOUNTER — Emergency Department (HOSPITAL_COMMUNITY): Payer: Medicare Other

## 2012-08-26 ENCOUNTER — Encounter (HOSPITAL_COMMUNITY): Payer: Self-pay | Admitting: Emergency Medicine

## 2012-08-26 ENCOUNTER — Emergency Department (HOSPITAL_COMMUNITY): Payer: Medicare Other

## 2012-08-26 ENCOUNTER — Emergency Department (HOSPITAL_COMMUNITY)
Admission: EM | Admit: 2012-08-26 | Discharge: 2012-08-26 | Disposition: A | Discharge status: Home / Self Care | Payer: Medicare Other | Attending: Emergency Medicine | Admitting: Emergency Medicine

## 2012-08-26 DIAGNOSIS — Z888 Allergy status to other drugs, medicaments and biological substances status: Secondary | ICD-10-CM | POA: Insufficient documentation

## 2012-08-26 DIAGNOSIS — F329 Major depressive disorder, single episode, unspecified: Secondary | ICD-10-CM | POA: Insufficient documentation

## 2012-08-26 DIAGNOSIS — F3289 Other specified depressive episodes: Secondary | ICD-10-CM | POA: Insufficient documentation

## 2012-08-26 DIAGNOSIS — J45901 Unspecified asthma with (acute) exacerbation: Secondary | ICD-10-CM | POA: Insufficient documentation

## 2012-08-26 HISTORY — DX: Major depressive disorder, single episode, unspecified: F32.9

## 2012-08-26 HISTORY — DX: Depression, unspecified: F32.A

## 2012-08-26 HISTORY — DX: Unspecified asthma, uncomplicated: J45.909

## 2012-08-26 MED ORDER — IPRATROPIUM BROMIDE 0.02 % IN SOLN
0.5000 mg | Freq: Once | RESPIRATORY_TRACT | Status: AC
  Administered 2012-08-26: 0.5 mg via RESPIRATORY_TRACT
  Filled 2012-08-26: qty 2.5

## 2012-08-26 MED ORDER — ALBUTEROL SULFATE (5 MG/ML) 0.5% IN NEBU
5.0000 mg | INHALATION_SOLUTION | Freq: Once | RESPIRATORY_TRACT | Status: AC
  Administered 2012-08-26: 5 mg via RESPIRATORY_TRACT
  Filled 2012-08-26: qty 1

## 2012-08-26 MED ORDER — ALBUTEROL SULFATE (2.5 MG/3ML) 0.083% IN NEBU: 2.5000 mg | INHALATION_SOLUTION | Freq: Four times a day (QID) | RESPIRATORY_TRACT | Status: AC | PRN

## 2012-08-26 MED ORDER — PREDNISONE 10 MG PO TABS: 60.0000 mg | ORAL_TABLET | Freq: Every day | ORAL | Status: AC

## 2012-08-26 MED ORDER — PREDNISONE 20 MG PO TABS
60.0000 mg | ORAL_TABLET | Freq: Once | ORAL | Status: AC
  Administered 2012-08-26: 60 mg via ORAL
  Filled 2012-08-26: qty 3

## 2012-08-26 NOTE — ED Notes (Signed)
Pt reports 3 week hx of SOB/asthma flare up; reports using inhaler with no relief; pt with exp wheezing throughout

## 2012-08-26 NOTE — ED Provider Notes (Signed)
History     CSN: [id number]  Arrival date & time 08/26/12  0102   First MD Initiated Contact with Patient 08/26/12 [id number]      Chief Complaint  Patient presents with  . Shortness of Breath     The history is provided by the patient.   patient reports worsening breathing for approximately 2-3 weeks.  She's been using her inhaler at home without improvement in her symptoms.  She reports no productive cough.  She's had no fevers or chills.  She has a long-standing history of asthma.  She reports this feels similar to a asthma exacerbation that she's had in the past.  She denies chest pain.  She has no unilateral leg swelling.  No history of DVT or pulmonary embolism.  A recent long travel or surgery.  Her symptoms are mild to moderate in severity  Past Medical History  Diagnosis Date  . Asthma   . Depression     Past Surgical History  Procedure Date  . Cesarean section     History reviewed. No pertinent family history.  History  Substance Use Topics  . Smoking status: Never Smoker   . Smokeless tobacco: Not on file  . Alcohol Use: No    OB History    Grav Para Term Preterm Abortions TAB SAB Ect Mult Living                  Review of Systems  Respiratory: Positive for shortness of breath.   All other systems reviewed and are negative.    Allergies  Aspirin and Caffeine  Home Medications   Current Outpatient Rx  Name Route Sig Dispense Refill  . ALBUTEROL SULFATE HFA 108 (90 BASE) MCG/ACT IN AERS Inhalation Inhale 2 puffs into the lungs every 6 (six) hours as needed. For breathing    . CLONAZEPAM 0.5 MG PO TABS Oral Take 0.25 mg by mouth 2 (two) times daily.    Marland Kitchen HYDROCODONE-ACETAMINOPHEN 5-325 MG PO TABS Oral Take 0.5 tablets by mouth every 6 (six) hours as needed. For pain    . RISPERIDONE 1 MG PO TABS Oral Take 1.5 mg by mouth daily.    . TRAZODONE HCL 150 MG PO TABS Oral Take 300 mg by mouth at bedtime.    . VENLAFAXINE HCL ER 75 MG PO CP24 Oral Take 225 mg  by mouth daily.      BP 147/113  Pulse 85  Temp 98.5 F (36.9 C) (Oral)  Resp 24  SpO2 92%  Physical Exam  Nursing note and vitals reviewed. Constitutional: She is oriented to person, place, and time. She appears well-developed and well-nourished. No distress.  HENT:  Head: Normocephalic and atraumatic.  Eyes: EOM are normal.  Neck: Normal range of motion.  Cardiovascular: Normal rate, regular rhythm and normal heart sounds.   Pulmonary/Chest: Effort normal. She has wheezes.  Abdominal: Soft. She exhibits no distension. There is no tenderness.  Musculoskeletal: Normal range of motion.  Neurological: She is alert and oriented to person, place, and time.  Skin: Skin is warm and dry.  Psychiatric: She has a normal mood and affect. Judgment normal.    ED Course  Procedures (including critical care time)  Labs Reviewed - No data to display Dg Chest 2 View  08/26/2012  *RADIOLOGY REPORT*  Clinical Data: Shortness of breath  CHEST - 2 VIEW  Comparison: 12/17/2010  Findings: The mild bronchitic change.  No consolidation, pleural effusion, or pneumothorax.  Cardiomediastinal contours within normal range.  No acute osseous finding.  IMPRESSION: Mild acute or chronic bronchitic change.  No focal consolidation.   Original Report Authenticated By: Waneta Martins, M.D.     I personally reviewed the imaging tests through PACS system I reviewed available ER/hospitalization records thought the EMR   1. Asthma exacerbation       MDM  5:54 AM Patient feels much better at this time.  Discharge home with prednisone and a refill of her albuterol nebulized medicine.  She understands return the emergency department for new or worsening symptoms        Lyanne Co, MD 08/27/12 2219

## 2012-10-21 IMAGING — CT CT HEAD W/O CM
2 series · 17 of 30 positions shown, 20 images · non-contrast
Comparison: 12/18/2010.

CLINICAL DATA: Left-sided weakness, anxiety and dizziness.  Fell
yesterday.  History of stroke.

CT HEAD WITHOUT CONTRAST
TECHNIQUE: Contiguous axial images were obtained from the base of
the skull through the vertex without contrast.

[Series 2: head_seq 4.5 h37s st · axial · 0.43mm/px · z∈[-146,-29]mm · 10 of 32 slices shown, 13 images]
[im 3/32  brain]
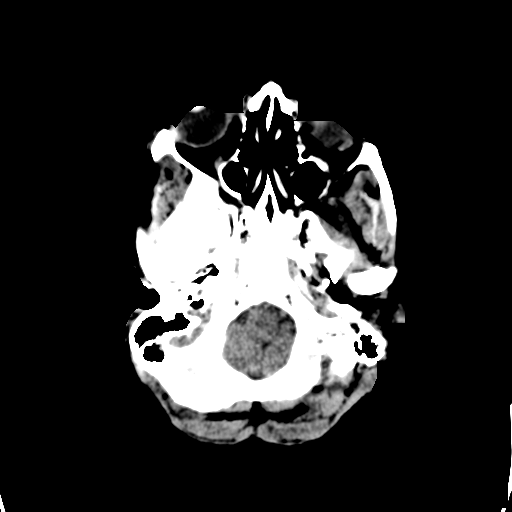
[im 3/32  bone]
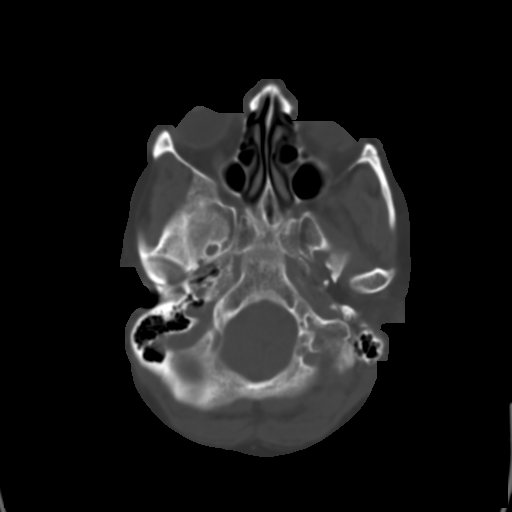
[im 6/32  brain]
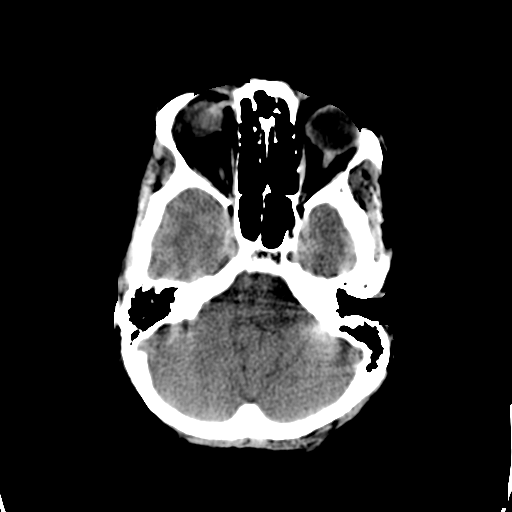
[im 9/32  brain]
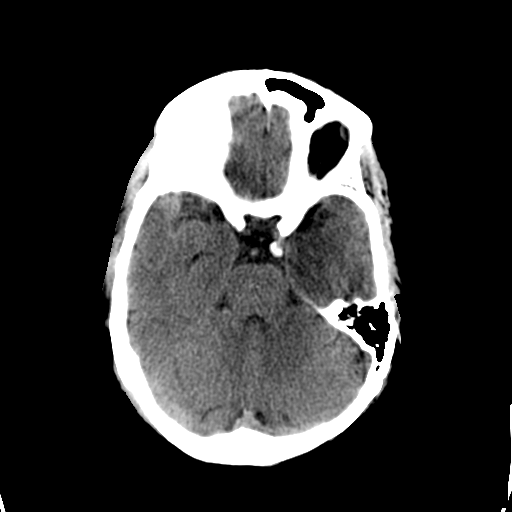
[im 12/32  brain]
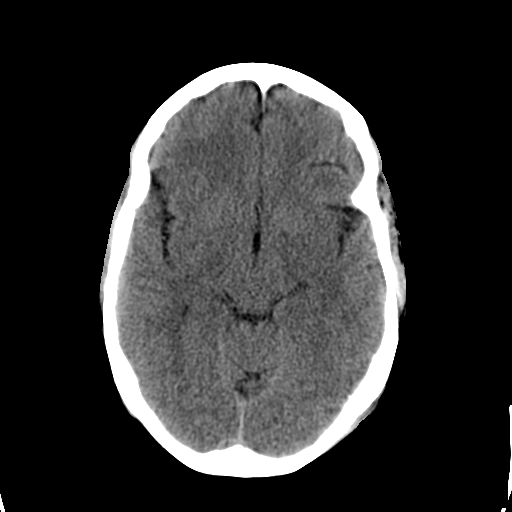
[im 15/32  brain]
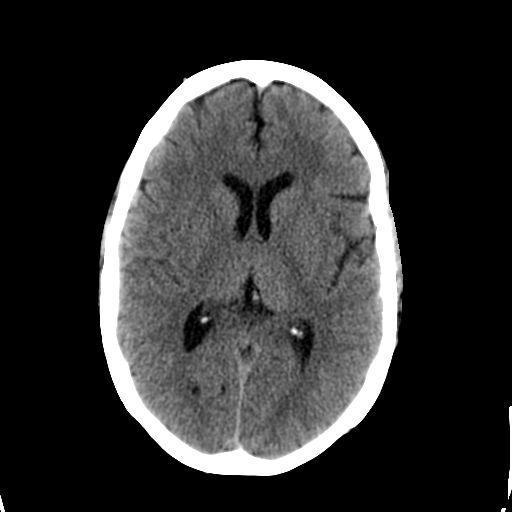
[im 15/32  bone]
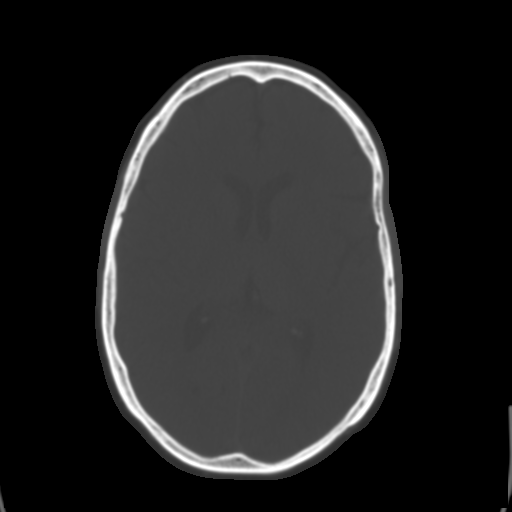
[im 17/32  brain]
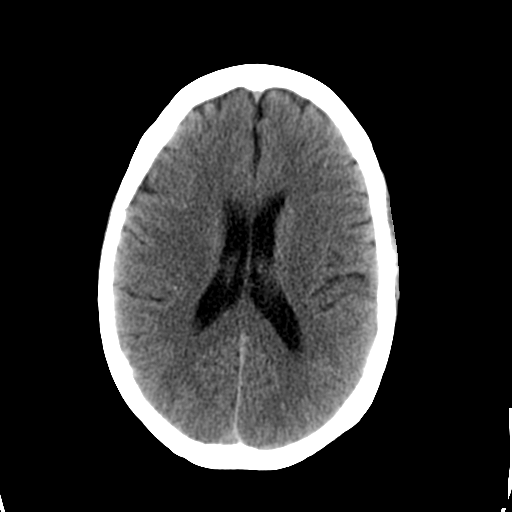
[im 20/32  brain]
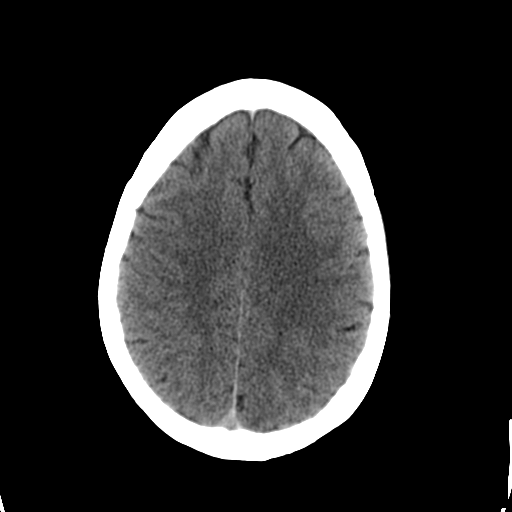
[im 23/32  brain]
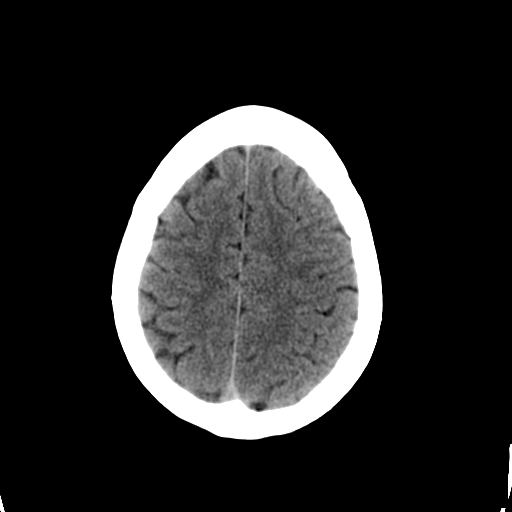
[im 26/32  brain]
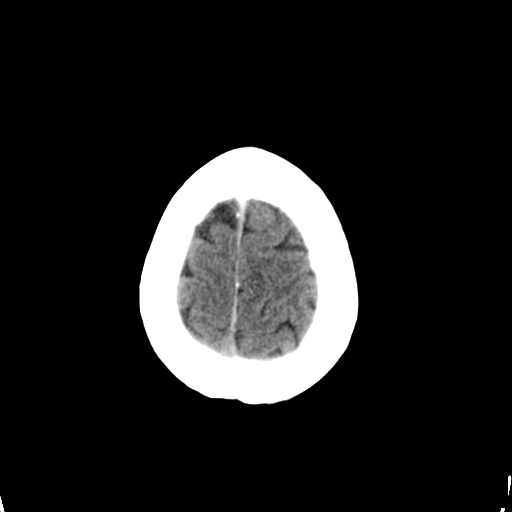
[im 26/32  bone]
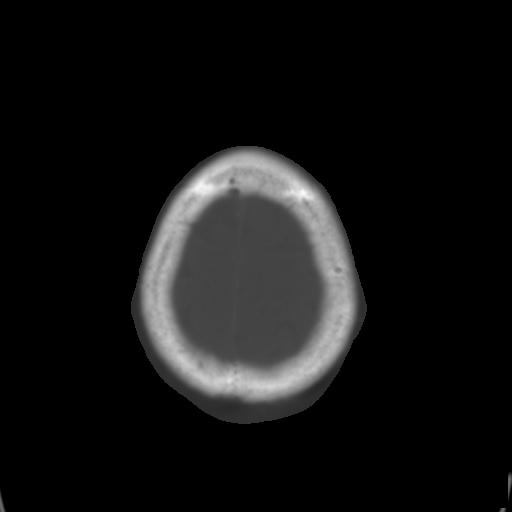
[im 29/32  brain]
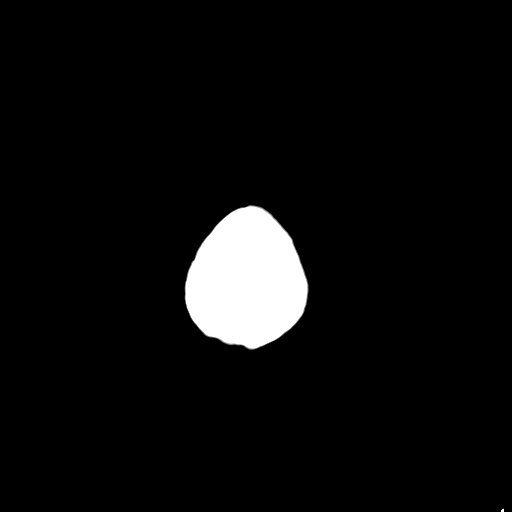

[Series 4: head_seq 3.0 h60s bone · axial · 0.43mm/px · z∈[-141,-42]mm · 7 of 48 slices shown]
[im 6/48  bone]
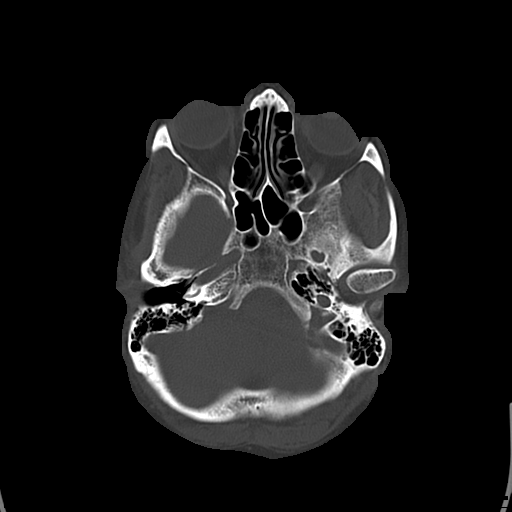
[im 12/48  bone]
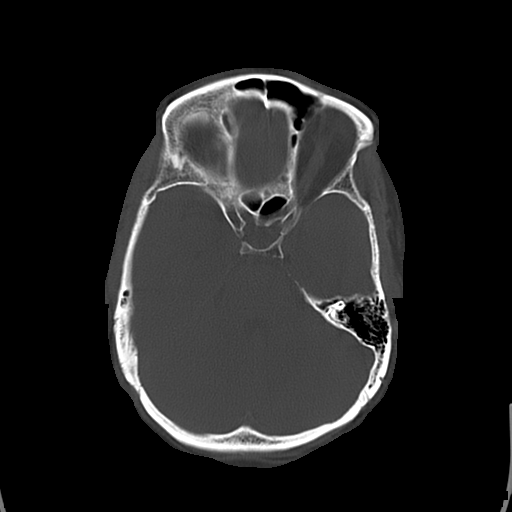
[im 17/48  bone]
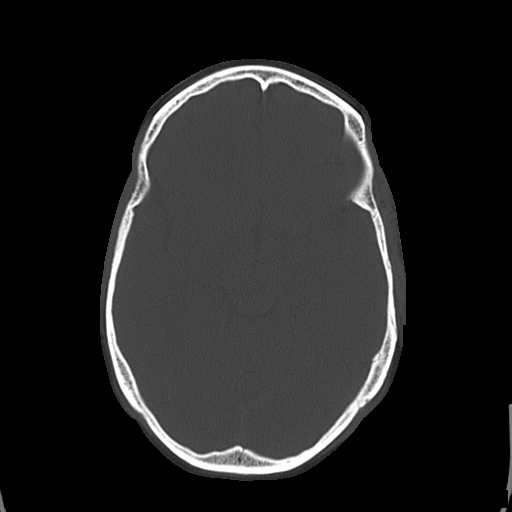
[im 23/48  bone]
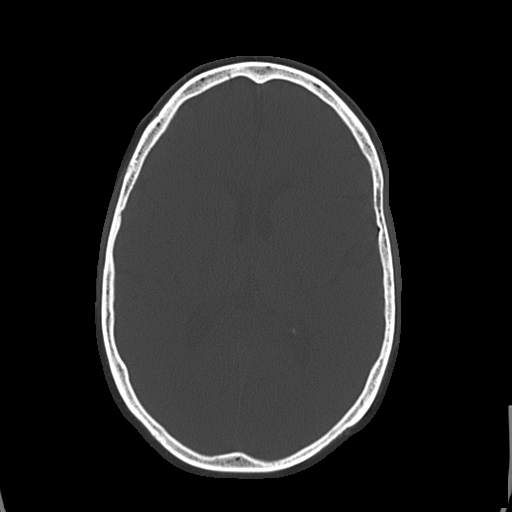
[im 28/48  bone]
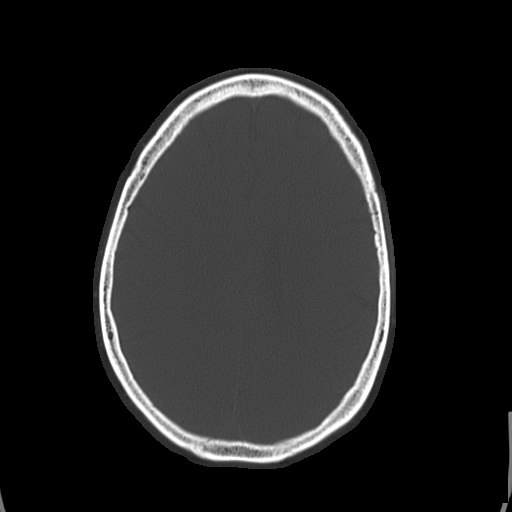
[im 34/48  bone]
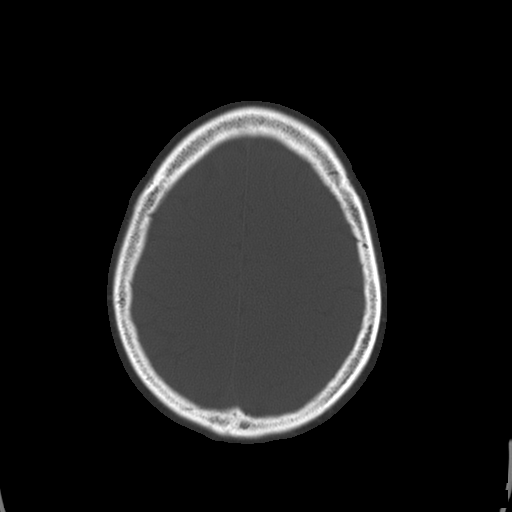
[im 39/48  bone]
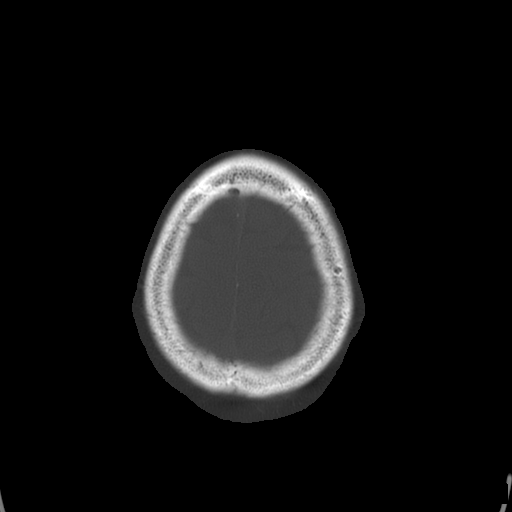

[17 of 30 positions shown; findings below may reference images not displayed]

FINDINGS: The ventricles and subarachnoid spaces remain minimally
enlarged.  No skull fracture, intracranial hemorrhage, mass lesion
or CT evidence of acute infarction.  No paranasal sinus air-fluid
levels.
IMPRESSION: 1.  No acute abnormality.
2.  Stable minimal diffuse cerebral atrophy.

## 2012-10-22 IMAGING — CR DG FOREARM 2V*L*
2 series · 2 of 2 positions shown · non-contrast
Comparison: None.

CLINICAL DATA: Pain and bruising

LEFT FOREARM - 2 VIEW

[view not recorded (1 of 2)]
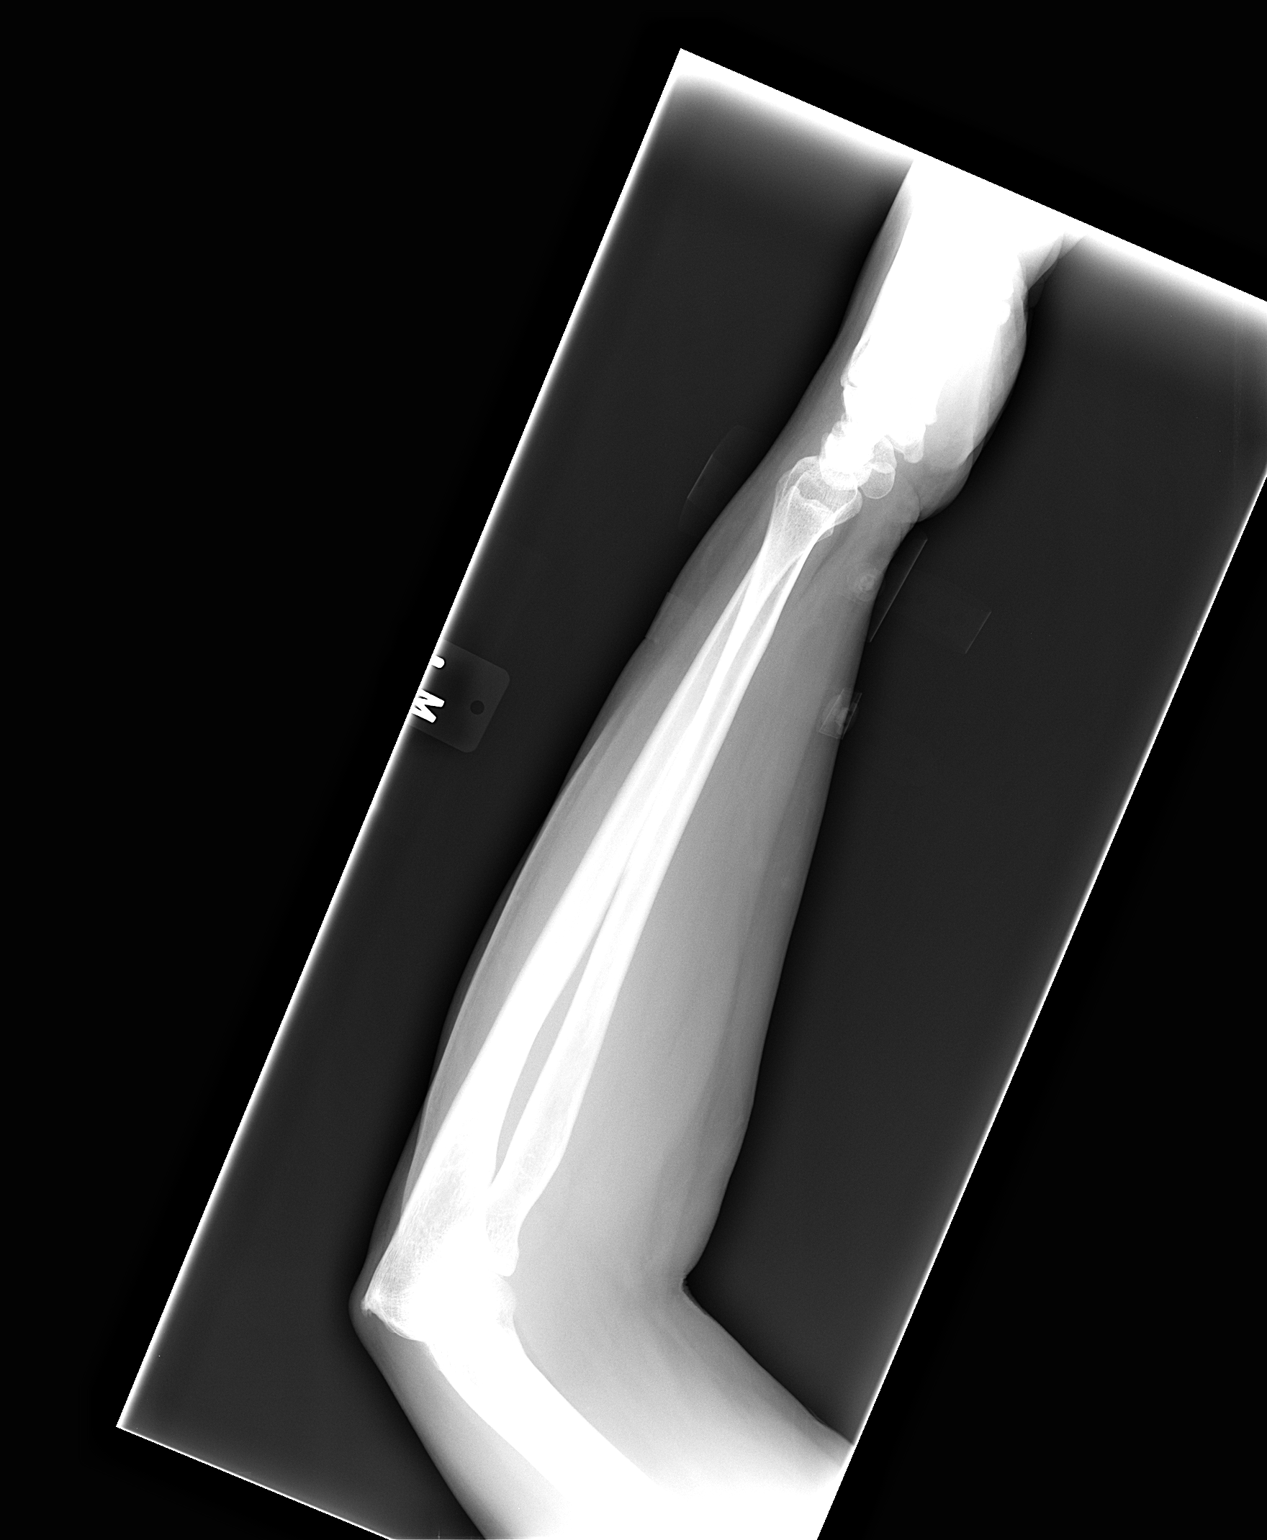

[view not recorded (2 of 2)]
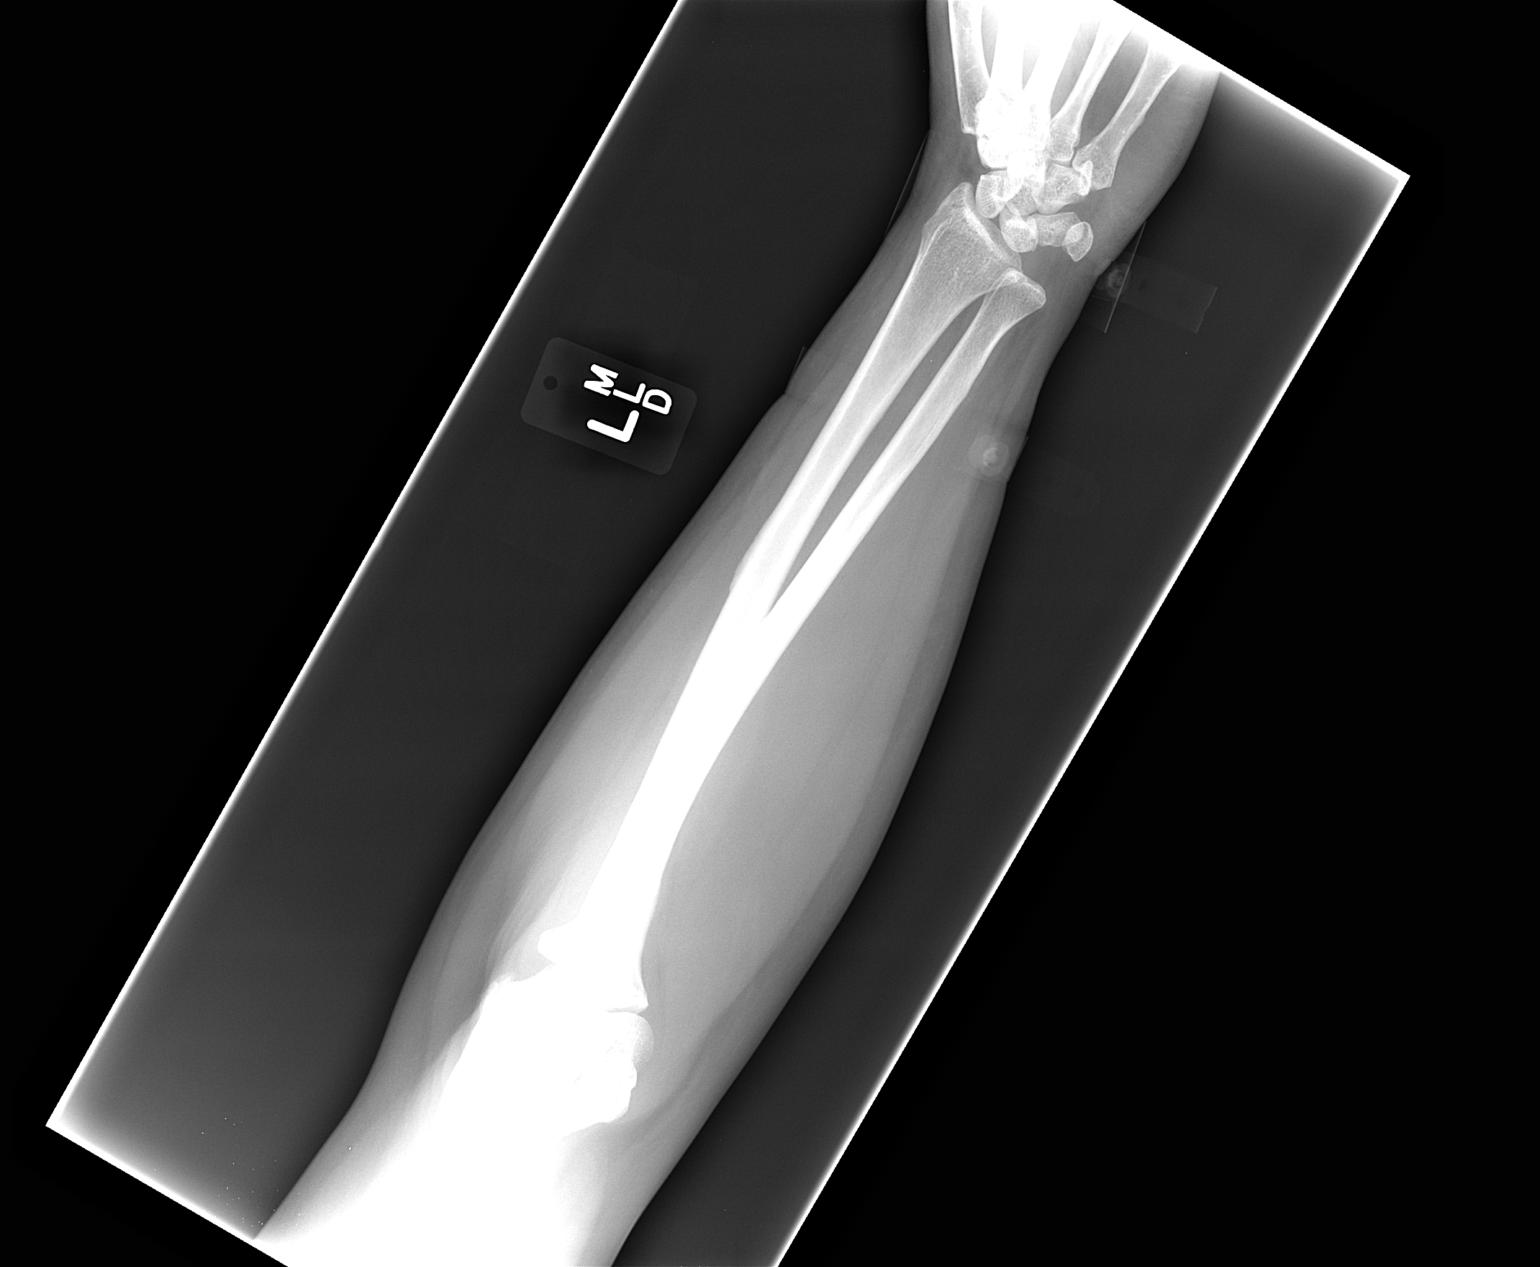

[2 of 2 positions shown; findings below may reference images not displayed]

FINDINGS: Negative for fracture.  No focal bony abnormality.
IMPRESSION: Negative

## 2013-04-21 ENCOUNTER — Emergency Department (HOSPITAL_COMMUNITY)
Admission: EM | Admit: 2013-04-21 | Discharge: 2013-04-21 | Disposition: A | Discharge status: Home / Self Care | Payer: Medicare Other | Source: Home / Self Care

## 2013-04-21 ENCOUNTER — Encounter (HOSPITAL_COMMUNITY): Payer: Self-pay

## 2013-04-21 ENCOUNTER — Emergency Department (INDEPENDENT_AMBULATORY_CARE_PROVIDER_SITE_OTHER): Payer: Medicare Other

## 2013-04-21 DIAGNOSIS — S6980XA Other specified injuries of unspecified wrist, hand and finger(s), initial encounter: Secondary | ICD-10-CM

## 2013-04-21 DIAGNOSIS — S6991XA Unspecified injury of right wrist, hand and finger(s), initial encounter: Secondary | ICD-10-CM

## 2013-04-21 DIAGNOSIS — IMO0002 Reserved for concepts with insufficient information to code with codable children: Secondary | ICD-10-CM

## 2013-04-21 DIAGNOSIS — S6990XA Unspecified injury of unspecified wrist, hand and finger(s), initial encounter: Secondary | ICD-10-CM

## 2013-04-21 MED ORDER — TRAMADOL-ACETAMINOPHEN 37.5-325 MG PO TABS: 1.0000 | ORAL_TABLET | Freq: Four times a day (QID) | ORAL | Status: AC | PRN

## 2013-04-21 NOTE — ED Notes (Addendum)
States she fell several days ago, injured her right index finger; deformity of finger PIP joint. Patient also asking for refills on her asthma medications

## 2013-04-21 NOTE — ED Provider Notes (Signed)
Medical screening examination/treatment/procedure(s) were performed by non-physician practitioner and as supervising physician I was immediately available for consultation/collaboration.  Leslee Home, M.D.  Reuben Likes, MD 04/21/13 [phone]

## 2013-04-21 NOTE — ED Provider Notes (Signed)
History     CSN: [id number]  Arrival date & time 04/21/13  1101   None     Chief Complaint  Patient presents with  . Finger Injury    (Consider location/radiation/quality/duration/timing/severity/associated sxs/prior treatment) HPI Comments: 2 weeks ago this 57 year old female was playing with a family member and fell to the ground and injured her right index finger. She developed some mild swelling at that time and that swelling has persisted over the past 2 weeks. She is complaining of pain primarily to the PIP joint with mild amount of swelling extending into the adjacent phalanges. Denies injury to the other digits, hand or wrist. This is her only complaint with no further injuries.   Past Medical History  Diagnosis Date  . Asthma   . Depression     Past Surgical History  Procedure Laterality Date  . Cesarean section      History reviewed. No pertinent family history.  History  Substance Use Topics  . Smoking status: Never Smoker   . Smokeless tobacco: Not on file  . Alcohol Use: No    OB History   Grav Para Term Preterm Abortions TAB SAB Ect Mult Living                  Review of Systems  Constitutional: Negative for fever, chills and activity change.  HENT: Negative.   Respiratory: Negative.   Cardiovascular: Negative.   Musculoskeletal:       As per HPI  Skin: Negative for color change, pallor and rash.  Neurological: Negative.     Allergies  Aspirin and Caffeine  Home Medications   Current Outpatient Rx  Name  Route  Sig  Dispense  Refill  . albuterol (PROVENTIL HFA;VENTOLIN HFA) 108 (90 BASE) MCG/ACT inhaler   Inhalation   Inhale 2 puffs into the lungs every 6 (six) hours as needed. For breathing         . albuterol (PROVENTIL) (2.5 MG/3ML) 0.083% nebulizer solution   Nebulization   Take 3 mLs (2.5 mg total) by nebulization every 6 (six) hours as needed for wheezing.   75 mL   6   . clonazePAM (KLONOPIN) 0.5 MG tablet   Oral   Take  0.25 mg by mouth 2 (two) times daily.         Marland Kitchen HYDROcodone-acetaminophen (NORCO/VICODIN) 5-325 MG per tablet   Oral   Take 0.5 tablets by mouth every 6 (six) hours as needed. For pain         . predniSONE (DELTASONE) 10 MG tablet   Oral   Take 6 tablets (60 mg total) by mouth daily.   30 tablet   0   . risperiDONE (RISPERDAL) 1 MG tablet   Oral   Take 1.5 mg by mouth daily.         . traMADol-acetaminophen (ULTRACET) 37.5-325 MG per tablet   Oral   Take 1 tablet by mouth every 6 (six) hours as needed for pain.   15 tablet   0   . traZODone (DESYREL) 150 MG tablet   Oral   Take 300 mg by mouth at bedtime.         Marland Kitchen venlafaxine XR (EFFEXOR-XR) 75 MG 24 hr capsule   Oral   Take 225 mg by mouth daily.           BP 115/74  Pulse 71  Temp(Src) 97.9 F (36.6 C) (Oral)  Resp 14  SpO2 94%  Physical Exam  Nursing note  and vitals reviewed. Constitutional: She is oriented to person, place, and time. She appears well-developed and well-nourished. No distress.  HENT:  Head: Normocephalic and atraumatic.  Eyes: EOM are normal. Pupils are equal, round, and reactive to light.  Neck: Neck supple.  Pulmonary/Chest: Effort normal.  Musculoskeletal:  There is swelling around the right index PIP joint with mild ecchymosis. No erythema or signs of infection. No deformity. She can actively flex and extend the digit/joints against resistance. Due to the swelling she is unable to completely flex the index finger. Distal neurovascular and sensory is completely intact. Palpation does not reveal apparent deformity. No tenderness, swelling or discoloration over the MCP.  Neurological: She is alert and oriented to person, place, and time. No cranial nerve deficit. She exhibits normal muscle tone.  Skin: Skin is warm and dry.  Psychiatric: She has a normal mood and affect.    ED Course  Procedures (including critical care time)  Labs Reviewed - No data to display Dg Finger Index  Right  04/21/2013  *RADIOLOGY REPORT*  Clinical Data: Right index finger injury and pain.  RIGHT INDEX FINGER 2+V  Comparison: None  Findings: There is no evidence of fracture, subluxation or dislocation. No radiopaque foreign bodies are present. Soft tissue swelling overlying the PIP joint noted. No focal bony lesions are identified.  IMPRESSION: Soft tissue swelling without bony abnormality.   Original Report Authenticated By: Harmon Pier, M.D.      1. Injury of index finger, right, initial encounter   2. Strain of tendons of finger, right, sequela       MDM  Finger splint to wear for 7-10 days. Limit use of the digit for the same. Time. Apply ice off and on 2-3 times a day for the next 3-4 days. Ultracet one Q4 to 6 hours when necessary pain For any worsening new symptoms or problems may return or call Dr. Wallace Cullens make as listed above.        Hayden Rasmussen, NP 04/21/13 1154

## 2013-08-15 ENCOUNTER — Other Ambulatory Visit: Payer: Self-pay

## 2013-08-15 DIAGNOSIS — Z1231 Encounter for screening mammogram for malignant neoplasm of breast: Secondary | ICD-10-CM

## 2013-09-10 ENCOUNTER — Ambulatory Visit: Payer: Medicare Other

## 2013-09-10 ENCOUNTER — Ambulatory Visit
Admission: RE | Admit: 2013-09-10 | Discharge: 2013-09-10 | Disposition: A | Discharge status: Home / Self Care | Payer: Medicare Other | Source: Ambulatory Visit

## 2013-09-10 DIAGNOSIS — Z1231 Encounter for screening mammogram for malignant neoplasm of breast: Secondary | ICD-10-CM

## 2014-10-03 ENCOUNTER — Ambulatory Visit
Admission: RE | Admit: 2014-10-03 | Discharge: 2014-10-03 | Disposition: A | Discharge status: Home / Self Care | Payer: Medicare Other | Source: Ambulatory Visit | Attending: Family | Admitting: Family

## 2014-10-03 ENCOUNTER — Other Ambulatory Visit: Payer: Self-pay | Admitting: Family

## 2014-10-03 DIAGNOSIS — M25562 Pain in left knee: Secondary | ICD-10-CM

## 2014-12-17 ENCOUNTER — Other Ambulatory Visit: Payer: Self-pay

## 2014-12-17 DIAGNOSIS — Z1231 Encounter for screening mammogram for malignant neoplasm of breast: Secondary | ICD-10-CM

## 2014-12-31 ENCOUNTER — Ambulatory Visit
Admission: RE | Admit: 2014-12-31 | Discharge: 2014-12-31 | Disposition: A | Discharge status: Home / Self Care | Payer: Medicare Other | Source: Ambulatory Visit

## 2014-12-31 DIAGNOSIS — Z1231 Encounter for screening mammogram for malignant neoplasm of breast: Secondary | ICD-10-CM
